# Patient Record
Sex: Female | Born: 1975 | Race: White | Hispanic: No | Marital: Married | Smoking: Never smoker
Health system: Southern US, Community
[De-identification: ages and names within clinical notes are randomized; demographics above are authoritative.]

## PROBLEM LIST (undated history)

## (undated) DIAGNOSIS — R42 Dizziness and giddiness: Secondary | ICD-10-CM

## (undated) HISTORY — PX: TUBAL LIGATION: SHX77

## (undated) HISTORY — PX: CHOLECYSTECTOMY: SHX55

---

## 2001-09-16 ENCOUNTER — Emergency Department (HOSPITAL_COMMUNITY): Admission: EM | Admit: 2001-09-16 | Discharge: 2001-09-16 | Payer: Self-pay | Admitting: Emergency Medicine

## 2004-02-26 ENCOUNTER — Emergency Department (HOSPITAL_COMMUNITY): Admission: EM | Admit: 2004-02-26 | Discharge: 2004-02-26 | Payer: Self-pay | Admitting: Emergency Medicine

## 2004-07-07 ENCOUNTER — Other Ambulatory Visit: Admission: RE | Admit: 2004-07-07 | Discharge: 2004-07-07 | Payer: Self-pay | Admitting: Family Medicine

## 2005-12-02 ENCOUNTER — Inpatient Hospital Stay (HOSPITAL_COMMUNITY): Admission: AD | Admit: 2005-12-02 | Discharge: 2005-12-02 | Payer: Self-pay | Admitting: Obstetrics

## 2006-01-21 ENCOUNTER — Inpatient Hospital Stay (HOSPITAL_COMMUNITY): Admission: AD | Admit: 2006-01-21 | Discharge: 2006-01-21 | Payer: Self-pay | Admitting: Obstetrics

## 2006-02-08 ENCOUNTER — Inpatient Hospital Stay (HOSPITAL_COMMUNITY): Admission: AD | Admit: 2006-02-08 | Discharge: 2006-02-09 | Payer: Self-pay | Admitting: Obstetrics

## 2006-02-13 ENCOUNTER — Inpatient Hospital Stay (HOSPITAL_COMMUNITY): Admission: AD | Admit: 2006-02-13 | Discharge: 2006-02-13 | Payer: Self-pay | Admitting: Obstetrics

## 2006-02-16 ENCOUNTER — Inpatient Hospital Stay (HOSPITAL_COMMUNITY): Admission: AD | Admit: 2006-02-16 | Discharge: 2006-02-18 | Payer: Self-pay | Admitting: Obstetrics

## 2006-02-16 ENCOUNTER — Encounter (INDEPENDENT_AMBULATORY_CARE_PROVIDER_SITE_OTHER): Payer: Self-pay | Admitting: Specialist

## 2007-06-22 ENCOUNTER — Inpatient Hospital Stay (HOSPITAL_COMMUNITY): Admission: AD | Admit: 2007-06-22 | Discharge: 2007-06-22 | Payer: Self-pay | Admitting: Obstetrics

## 2008-01-22 ENCOUNTER — Inpatient Hospital Stay (HOSPITAL_COMMUNITY): Admission: AD | Admit: 2008-01-22 | Discharge: 2008-01-24 | Payer: Self-pay | Admitting: Obstetrics

## 2009-12-04 ENCOUNTER — Ambulatory Visit (HOSPITAL_COMMUNITY): Admission: RE | Admit: 2009-12-04 | Discharge: 2009-12-04 | Payer: Self-pay | Admitting: Obstetrics

## 2010-01-01 ENCOUNTER — Inpatient Hospital Stay (HOSPITAL_COMMUNITY): Admission: AD | Admit: 2010-01-01 | Discharge: 2010-01-01 | Payer: Self-pay | Admitting: Obstetrics

## 2010-01-07 ENCOUNTER — Ambulatory Visit (HOSPITAL_COMMUNITY): Admission: RE | Admit: 2010-01-07 | Discharge: 2010-01-07 | Payer: Self-pay | Admitting: Obstetrics

## 2010-02-17 ENCOUNTER — Ambulatory Visit (HOSPITAL_COMMUNITY): Admission: RE | Admit: 2010-02-17 | Discharge: 2010-02-17 | Payer: Self-pay | Admitting: Obstetrics

## 2010-03-31 ENCOUNTER — Ambulatory Visit: Payer: Self-pay | Admitting: Nurse Practitioner

## 2010-03-31 ENCOUNTER — Inpatient Hospital Stay (HOSPITAL_COMMUNITY): Admission: AD | Admit: 2010-03-31 | Discharge: 2010-03-31 | Payer: Self-pay | Admitting: Obstetrics

## 2010-04-23 ENCOUNTER — Inpatient Hospital Stay (HOSPITAL_COMMUNITY): Admission: RE | Admit: 2010-04-23 | Discharge: 2010-04-25 | Payer: Self-pay | Admitting: Obstetrics

## 2010-04-24 ENCOUNTER — Encounter (INDEPENDENT_AMBULATORY_CARE_PROVIDER_SITE_OTHER): Payer: Self-pay | Admitting: Obstetrics

## 2011-01-03 LAB — CBC
HCT: 30.9 % — ABNORMAL LOW (ref 36.0–46.0)
HCT: 34.9 % — ABNORMAL LOW (ref 36.0–46.0)
Hemoglobin: 10.7 g/dL — ABNORMAL LOW (ref 12.0–15.0)
Hemoglobin: 12.1 g/dL (ref 12.0–15.0)
MCH: 29.6 pg (ref 26.0–34.0)
MCH: 29.8 pg (ref 26.0–34.0)
MCHC: 34.5 g/dL (ref 30.0–36.0)
MCHC: 34.6 g/dL (ref 30.0–36.0)
MCV: 85.5 fL (ref 78.0–100.0)
MCV: 86.3 fL (ref 78.0–100.0)
Platelets: 146 10*3/uL — ABNORMAL LOW (ref 150–400)
Platelets: 166 10*3/uL (ref 150–400)
RBC: 3.58 MIL/uL — ABNORMAL LOW (ref 3.87–5.11)
RBC: 4.08 MIL/uL (ref 3.87–5.11)
RDW: 14.7 % (ref 11.5–15.5)
RDW: 14.8 % (ref 11.5–15.5)
WBC: 14 10*3/uL — ABNORMAL HIGH (ref 4.0–10.5)
WBC: 9.6 10*3/uL (ref 4.0–10.5)

## 2011-01-03 LAB — RPR: RPR Ser Ql: NONREACTIVE

## 2011-01-04 LAB — URINALYSIS, ROUTINE W REFLEX MICROSCOPIC
Bilirubin Urine: NEGATIVE
Glucose, UA: NEGATIVE mg/dL
Ketones, ur: NEGATIVE mg/dL
Leukocytes, UA: NEGATIVE
Nitrite: NEGATIVE
Protein, ur: NEGATIVE mg/dL
Specific Gravity, Urine: 1.01 (ref 1.005–1.030)
Urobilinogen, UA: 0.2 mg/dL (ref 0.0–1.0)
pH: 7 (ref 5.0–8.0)

## 2011-01-04 LAB — URINE MICROSCOPIC-ADD ON

## 2011-01-10 LAB — CBC
HCT: 33.1 % — ABNORMAL LOW (ref 36.0–46.0)
Hemoglobin: 11.2 g/dL — ABNORMAL LOW (ref 12.0–15.0)
MCHC: 33.9 g/dL (ref 30.0–36.0)
MCV: 86.5 fL (ref 78.0–100.0)
Platelets: 171 10*3/uL (ref 150–400)
RBC: 3.83 MIL/uL — ABNORMAL LOW (ref 3.87–5.11)
RDW: 13.4 % (ref 11.5–15.5)
WBC: 11.4 10*3/uL — ABNORMAL HIGH (ref 4.0–10.5)

## 2011-01-10 LAB — URINALYSIS, ROUTINE W REFLEX MICROSCOPIC
Bilirubin Urine: NEGATIVE
Glucose, UA: NEGATIVE mg/dL
Ketones, ur: NEGATIVE mg/dL
Leukocytes, UA: NEGATIVE
Nitrite: NEGATIVE
Protein, ur: NEGATIVE mg/dL
Specific Gravity, Urine: <1.005 — ABNORMAL LOW (ref 1.005–1.030)
Urobilinogen, UA: 0.2 mg/dL (ref 0.0–1.0)
pH: 7.5 (ref 5.0–8.0)

## 2011-01-10 LAB — COMPREHENSIVE METABOLIC PANEL
ALT: 12 U/L (ref 0–35)
AST: 14 U/L (ref 0–37)
Albumin: 3.2 g/dL — ABNORMAL LOW (ref 3.5–5.2)
Alkaline Phosphatase: 62 U/L (ref 39–117)
BUN: 6 mg/dL (ref 6–23)
CO2: 24 mEq/L (ref 19–32)
Calcium: 8.9 mg/dL (ref 8.4–10.5)
Chloride: 104 mEq/L (ref 96–112)
Creatinine, Ser: 0.46 mg/dL (ref 0.4–1.2)
GFR calc Af Amer: >60 mL/min (ref >60)
GFR calc non Af Amer: >60 mL/min (ref >60)
Glucose, Bld: 83 mg/dL (ref 70–99)
Potassium: 3.5 mEq/L (ref 3.5–5.1)
Sodium: 136 mEq/L (ref 135–145)
Total Bilirubin: 0.1 mg/dL — ABNORMAL LOW (ref 0.3–1.2)
Total Protein: 6.4 g/dL (ref 6.0–8.3)

## 2011-01-10 LAB — GLUCOSE, CAPILLARY: Glucose-Capillary: 85 mg/dL (ref 70–99)

## 2011-01-10 LAB — URINE MICROSCOPIC-ADD ON

## 2011-07-13 LAB — CBC
HCT: 37
Hemoglobin: 12.9
MCHC: 34.8
MCV: 86
Platelets: 148 — ABNORMAL LOW
RBC: 4.3
RDW: 14.6
WBC: 8.1

## 2011-07-13 LAB — RPR: RPR Ser Ql: NONREACTIVE

## 2011-07-30 LAB — URINE MICROSCOPIC-ADD ON

## 2011-07-30 LAB — URINALYSIS, ROUTINE W REFLEX MICROSCOPIC
Bilirubin Urine: NEGATIVE
Glucose, UA: NEGATIVE
Ketones, ur: NEGATIVE
Leukocytes, UA: NEGATIVE
Nitrite: NEGATIVE
Protein, ur: NEGATIVE
Specific Gravity, Urine: 1.025
Urobilinogen, UA: 1
pH: 6

## 2011-07-30 LAB — WET PREP, GENITAL
Clue Cells Wet Prep HPF POC: NONE SEEN
Trich, Wet Prep: NONE SEEN
Yeast Wet Prep HPF POC: NONE SEEN

## 2011-07-30 LAB — GC/CHLAMYDIA PROBE AMP, GENITAL
Chlamydia, DNA Probe: NEGATIVE
GC Probe Amp, Genital: NEGATIVE

## 2011-12-22 ENCOUNTER — Emergency Department (INDEPENDENT_AMBULATORY_CARE_PROVIDER_SITE_OTHER): Payer: 59

## 2011-12-22 ENCOUNTER — Emergency Department (HOSPITAL_BASED_OUTPATIENT_CLINIC_OR_DEPARTMENT_OTHER)
Admission: EM | Admit: 2011-12-22 | Discharge: 2011-12-23 | Disposition: A | Discharge status: Home / Self Care | Payer: 59 | Attending: Emergency Medicine | Admitting: Emergency Medicine

## 2011-12-22 ENCOUNTER — Other Ambulatory Visit: Payer: Self-pay

## 2011-12-22 ENCOUNTER — Encounter (HOSPITAL_BASED_OUTPATIENT_CLINIC_OR_DEPARTMENT_OTHER): Payer: Self-pay | Admitting: *Deleted

## 2011-12-22 DIAGNOSIS — R112 Nausea with vomiting, unspecified: Secondary | ICD-10-CM | POA: Insufficient documentation

## 2011-12-22 DIAGNOSIS — H8110 Benign paroxysmal vertigo, unspecified ear: Secondary | ICD-10-CM | POA: Insufficient documentation

## 2011-12-22 DIAGNOSIS — J40 Bronchitis, not specified as acute or chronic: Secondary | ICD-10-CM

## 2011-12-22 DIAGNOSIS — E860 Dehydration: Secondary | ICD-10-CM

## 2011-12-22 DIAGNOSIS — R55 Syncope and collapse: Secondary | ICD-10-CM | POA: Insufficient documentation

## 2011-12-22 HISTORY — DX: Dizziness and giddiness: R42

## 2011-12-22 LAB — CBC
HCT: 36.6 % (ref 36.0–46.0)
Hemoglobin: 12.9 g/dL (ref 12.0–15.0)
MCH: 28.5 pg (ref 26.0–34.0)
MCHC: 35.2 g/dL (ref 30.0–36.0)
MCV: 80.8 fL (ref 78.0–100.0)
Platelets: 232 10*3/uL (ref 150–400)
RBC: 4.53 MIL/uL (ref 3.87–5.11)
RDW: 13 % (ref 11.5–15.5)
WBC: 17.2 10*3/uL — ABNORMAL HIGH (ref 4.0–10.5)

## 2011-12-22 LAB — BASIC METABOLIC PANEL
BUN: 12 mg/dL (ref 6–23)
CO2: 20 mEq/L (ref 19–32)
Calcium: 9.5 mg/dL (ref 8.4–10.5)
Chloride: 104 mEq/L (ref 96–112)
Creatinine, Ser: 0.6 mg/dL (ref 0.50–1.10)
GFR calc Af Amer: >90 mL/min (ref >90)
GFR calc non Af Amer: >90 mL/min (ref >90)
Glucose, Bld: 150 mg/dL — ABNORMAL HIGH (ref 70–99)
Potassium: 3.6 mEq/L (ref 3.5–5.1)
Sodium: 138 mEq/L (ref 135–145)

## 2011-12-22 MED ORDER — ONDANSETRON HCL 4 MG/2ML IJ SOLN
INTRAMUSCULAR | Status: AC
  Administered 2011-12-23: 4 mg
  Filled 2011-12-22: qty 2

## 2011-12-22 MED ORDER — ONDANSETRON HCL 4 MG/2ML IJ SOLN
4.0000 mg | Freq: Once | INTRAMUSCULAR | Status: AC
  Administered 2011-12-22: 4 mg via INTRAVENOUS
  Filled 2011-12-22: qty 2

## 2011-12-22 MED ORDER — MECLIZINE HCL 25 MG PO TABS
25.0000 mg | ORAL_TABLET | Freq: Once | ORAL | Status: AC
  Administered 2011-12-22: 25 mg via ORAL
  Filled 2011-12-22: qty 1

## 2011-12-22 MED ORDER — SODIUM CHLORIDE 0.9 % IV BOLUS (SEPSIS)
1000.0000 mL | Freq: Once | INTRAVENOUS | Status: AC
  Administered 2011-12-22: 1000 mL via INTRAVENOUS

## 2011-12-22 NOTE — ED Notes (Signed)
DR Palumbo at bedside. 

## 2011-12-22 NOTE — ED Notes (Signed)
Pt brought in by EMS , syncopal episode at home, HX vertigo

## 2011-12-22 NOTE — ED Notes (Signed)
Pt reports sensation of vertigo while at home this evening. Pt took a meclizine PTA, but states it did not help. Pt c/o dizziness and nausea. Pt received zofran 4mg  en route by EMS. Pt no longer vomiting, but states she is very dizzy. Lights dimmed for comfort.

## 2011-12-23 DIAGNOSIS — R404 Transient alteration of awareness: Secondary | ICD-10-CM

## 2011-12-23 LAB — URINALYSIS, ROUTINE W REFLEX MICROSCOPIC
Bilirubin Urine: NEGATIVE
Glucose, UA: NEGATIVE mg/dL
Hgb urine dipstick: NEGATIVE
Ketones, ur: >80 mg/dL — AB
Leukocytes, UA: NEGATIVE
Nitrite: NEGATIVE
Protein, ur: NEGATIVE mg/dL
Specific Gravity, Urine: 1.014 (ref 1.005–1.030)
Urobilinogen, UA: 1 mg/dL (ref 0.0–1.0)
pH: 7 (ref 5.0–8.0)

## 2011-12-23 LAB — PREGNANCY, URINE: Preg Test, Ur: NEGATIVE

## 2011-12-23 MED ORDER — AZITHROMYCIN 250 MG PO TABS: 250.0000 mg | ORAL_TABLET | Freq: Every day | ORAL | Status: AC

## 2011-12-23 MED ORDER — LORAZEPAM 2 MG/ML IJ SOLN
INTRAMUSCULAR | Status: AC
  Administered 2011-12-23: 1 mg
  Filled 2011-12-23: qty 1

## 2011-12-23 MED ORDER — MECLIZINE HCL 25 MG PO TABS
25.0000 mg | ORAL_TABLET | Freq: Once | ORAL | Status: AC
  Administered 2011-12-23: 25 mg via ORAL
  Filled 2011-12-23: qty 1

## 2011-12-23 MED ORDER — ONDANSETRON HCL 4 MG/2ML IJ SOLN
4.0000 mg | Freq: Once | INTRAMUSCULAR | Status: AC
  Administered 2011-12-23: 4 mg via INTRAVENOUS
  Filled 2011-12-23: qty 2

## 2011-12-23 MED ORDER — MECLIZINE HCL 12.5 MG PO TABS: 12.5000 mg | ORAL_TABLET | Freq: Three times a day (TID) | ORAL | Status: AC | PRN

## 2011-12-23 NOTE — ED Notes (Signed)
Pt reports she still feels dizzy. nasuea mildly relieved. Pt resting with eyes closed.

## 2011-12-23 NOTE — ED Notes (Signed)
Dr Nicanor Alcon at bedside speaking with patient/husband.

## 2011-12-23 NOTE — ED Notes (Signed)
Pt was able to sit up, stand up, and ambulate without dizziness occuring. Pt states she feels ready for discharge. Husband at bedside. EDP made aware.

## 2011-12-23 NOTE — ED Notes (Signed)
Pt vomited large amt of emesis following medication ingestion. Pt reports severe nausea. Will notify EDP

## 2011-12-23 NOTE — ED Provider Notes (Signed)
History     CSN: [id number]  Arrival date & time 12/22/11  2216   First MD Initiated Contact with Patient 12/22/11 2334      (Consider location/radiation/quality/duration/timing/severity/associated sxs/prior treatment) The history is provided by the patient and a relative. No language interpreter was used.  Patient presents with sudden onset intense vertigo.  Positional.  Worse with head movement to either side.  No tinnitus.  No hearing loss.  No weakness or numbness.  No visual changes no speech difficulty.  No focal neuro deficits.  There is associated nausea and vomiting.  No OCP.  No chest pain no shortness of breath.  No DOE.  No leg swelling no long car trips or plane trips.  PERC negative Patient doe not recall passing out but husband who is not present reportedly witness LOC for a sec.  No seizure like activity Past Medical History  Diagnosis Date  . Vertigo     Past Surgical History  Procedure Date  . Tubal ligation   . Cholecystectomy     History reviewed. No pertinent family history.  History  Substance Use Topics  . Smoking status: Never Smoker   . Smokeless tobacco: Not on file  . Alcohol Use: No    OB History    Grav Para Term Preterm Abortions TAB SAB Ect Mult Living                  Review of Systems  Constitutional: Negative.  Negative for fever.  HENT: Negative.  Negative for hearing loss, ear pain, neck pain and neck stiffness.   Eyes: Negative for photophobia and pain.  Respiratory: Negative.  Negative for shortness of breath.   Cardiovascular: Positive for syncope. Negative for chest pain and leg swelling.  Gastrointestinal: Positive for nausea and vomiting. Negative for abdominal distention.  Genitourinary: Negative for difficulty urinating.  Musculoskeletal: Negative.   Skin: Negative.   Neurological: Negative for seizures, weakness and numbness.  Hematological: Negative.   Psychiatric/Behavioral: Negative.     Allergies  Review of  patient's allergies indicates no known allergies.  Home Medications   Current Outpatient Rx  Name Route Sig Dispense Refill  . VITAMIN B COMPLEX PO Oral Take 1 tablet by mouth daily.      BP 113/65  Pulse 94  Temp(Src) 97.6 F (36.4 C) (Oral)  Resp 18  Ht 4' 11.5" (1.511 m)  Wt 180 lb (81.647 kg)  BMI 35.75 kg/m2  SpO2 98%  LMP 12/02/2011  Physical Exam  Constitutional: She is oriented to person, place, and time. She appears well-developed and well-nourished. No distress.  HENT:  Head: Normocephalic and atraumatic.  Right Ear: No mastoid tenderness. Tympanic membrane is not injected. No hemotympanum.  Left Ear: No mastoid tenderness. Tympanic membrane is not injected. No hemotympanum.  Mouth/Throat: Oropharynx is clear and moist. No oropharyngeal exudate.  Eyes: Conjunctivae and EOM are normal. Pupils are equal, round, and reactive to light.  Neck: Normal range of motion. Neck supple.  Cardiovascular: Normal rate and regular rhythm.   Pulmonary/Chest: Effort normal and breath sounds normal.  Abdominal: Soft. Bowel sounds are normal. There is no tenderness. There is no rebound and no guarding.  Musculoskeletal: Normal range of motion. She exhibits no edema.  Lymphadenopathy:    She has no cervical adenopathy.  Neurological: She is alert and oriented to person, place, and time. She has normal reflexes. No cranial nerve deficit.  Skin: Skin is warm and dry.  Psychiatric: She has a normal mood and  affect.    ED Course  Procedures (including critical care time)  Labs Reviewed  CBC - Abnormal; Notable for the following:    WBC 17.2 (*)    All other components within normal limits  BASIC METABOLIC PANEL - Abnormal; Notable for the following:    Glucose, Bld 150 (*)    All other components within normal limits  URINALYSIS, ROUTINE W REFLEX MICROSCOPIC - Abnormal; Notable for the following:    Ketones, ur >80 (*)    All other components within normal limits  PREGNANCY,  URINE   Dg Chest 2 View  12/23/2011  *RADIOLOGY REPORT*  Clinical Data: Loss of consciousness  CHEST - 2 VIEW  Comparison: None  Findings: Upper-normal size of cardiac silhouette. Mediastinal contours and pulmonary vascularity normal. Peribronchial thickening. No pulmonary infiltrate, pleural effusion, or pneumothorax. Bones unremarkable.  IMPRESSION: Minimal bronchitic changes.  Original Report Authenticated By: Lollie Marrow, M.D.     No diagnosis found.    MDM   Date: 12/23/2011  Rate: 90  Rhythm: normal sinus rhythm  QRS Axis: normal  Intervals: normal  ST/T Wave abnormalities: normal  Conduction Disutrbances:none  Narrative Interpretation:   Old EKG Reviewed: none available    Return for worsening symptoms or weakness numbness, chest pain shortness of breath or any concerns.  Follow up with your doctor for recheck in 48 hours      Michaelyn Wall K Maks Cavallero-Rasch, MD 12/23/11 [phone]

## 2011-12-23 NOTE — Discharge Instructions (Signed)
Bronchitis Bronchitis is the body's way of reacting to injury and/or infection (inflammation) of the bronchi. Bronchi are the air tubes that extend from the windpipe into the lungs. If the inflammation becomes severe, it may cause shortness of breath. CAUSES  Inflammation may be caused by:  A virus.   Germs (bacteria).   Dust.   Allergens.   Pollutants and many other irritants.  The cells lining the bronchial tree are covered with tiny hairs (cilia). These constantly beat upward, away from the lungs, toward the mouth. This keeps the lungs free of pollutants. When these cells become too irritated and are unable to do their job, mucus begins to develop. This causes the characteristic cough of bronchitis. The cough clears the lungs when the cilia are unable to do their job. Without either of these protective mechanisms, the mucus would settle in the lungs. Then you would develop pneumonia. Smoking is a common cause of bronchitis and can contribute to pneumonia. Stopping this habit is the single most important thing you can do to help yourself. TREATMENT   Your caregiver may prescribe an antibiotic if the cough is caused by bacteria. Also, medicines that open up your airways make it easier to breathe. Your caregiver may also recommend or prescribe an expectorant. It will loosen the mucus to be coughed up. Only take over-the-counter or prescription medicines for pain, discomfort, or fever as directed by your caregiver.   Removing whatever causes the problem (smoking, for example) is critical to preventing the problem from getting worse.   Cough suppressants may be prescribed for relief of cough symptoms.   Inhaled medicines may be prescribed to help with symptoms now and to help prevent problems from returning.   For those with recurrent (chronic) bronchitis, there may be a need for steroid medicines.  SEEK IMMEDIATE MEDICAL CARE IF:   During treatment, you develop more pus-like mucus  (purulent sputum).   You have a fever.   Your baby is older than 3 months with a rectal temperature of 102 F (38.9 C) or higher.   Your baby is 64 months old or younger with a rectal temperature of 100.4 F (38 C) or higher.   You become progressively more ill.   You have increased difficulty breathing, wheezing, or shortness of breath.  It is necessary to seek immediate medical care if you are elderly or sick from any other disease. MAKE SURE YOU:   Understand these instructions.   Will watch your condition.   Will get help right away if you are not doing well or get worse.  Document Released: 10/04/2005 Document Revised: 09/23/2011 Document Reviewed: 08/13/2008 [facility] Patient Information 2012 [place], [place].Benign Positional Vertigo Vertigo means you feel like you or your surroundings are moving when they are not. Benign positional vertigo is the most common form of vertigo. Benign means that the cause of your condition is not serious. Benign positional vertigo is more common in older adults. CAUSES  Benign positional vertigo is the result of an upset in the labyrinth system. This is an area in the middle ear that helps control your balance. This may be caused by a viral infection, head injury, or repetitive motion. However, often no specific cause is found. SYMPTOMS  Symptoms of benign positional vertigo occur when you move your head or eyes in different directions. Some of the symptoms may include:  Loss of balance and falls.   Vomiting.   Blurred vision.   Dizziness.   Nausea.   Involuntary eye  movements (nystagmus).  DIAGNOSIS  Benign positional vertigo is usually diagnosed by physical exam. If the specific cause of your benign positional vertigo is unknown, your caregiver may perform imaging tests, such as magnetic resonance imaging (MRI) or computed tomography (CT). TREATMENT  Your caregiver may recommend movements or procedures to correct the benign positional  vertigo. Medicines such as meclizine, benzodiazepines, and medicines for nausea may be used to treat your symptoms. In rare cases, if your symptoms are caused by certain conditions that affect the inner ear, you may need surgery. HOME CARE INSTRUCTIONS   Follow your caregiver's instructions.   Move slowly. Do not make sudden body or head movements.   Avoid driving.   Avoid operating heavy machinery.   Avoid performing any tasks that would be dangerous to you or others during a vertigo episode.   Drink enough fluids to keep your urine clear or pale yellow.  SEEK IMMEDIATE MEDICAL CARE IF:   You develop problems with walking, weakness, numbness, or using your arms, hands, or legs.   You have difficulty speaking.   You develop severe headaches.   Your nausea or vomiting continues or gets worse.   You develop visual changes.   Your family or friends notice any behavioral changes.   Your condition gets worse.   You have a fever.   You develop a stiff neck or sensitivity to light.  MAKE SURE YOU:   Understand these instructions.   Will watch your condition.   Will get help right away if you are not doing well or get worse.  Document Released: 07/12/2006 Document Revised: 09/23/2011 Document Reviewed: 06/24/2011 [facility] Patient Information 2012 [place], [place].

## 2011-12-23 NOTE — ED Notes (Signed)
Pt in XR at this time

## 2013-06-29 ENCOUNTER — Ambulatory Visit: Payer: 59

## 2015-02-14 ENCOUNTER — Emergency Department (HOSPITAL_COMMUNITY): Payer: Managed Care, Other (non HMO)

## 2015-02-14 ENCOUNTER — Emergency Department (HOSPITAL_COMMUNITY)
Admission: EM | Admit: 2015-02-14 | Discharge: 2015-02-14 | Disposition: A | Discharge status: Home / Self Care | Payer: Managed Care, Other (non HMO) | Attending: Emergency Medicine | Admitting: Emergency Medicine

## 2015-02-14 ENCOUNTER — Encounter (HOSPITAL_COMMUNITY): Payer: Self-pay | Admitting: Emergency Medicine

## 2015-02-14 DIAGNOSIS — Z79899 Other long term (current) drug therapy: Secondary | ICD-10-CM | POA: Insufficient documentation

## 2015-02-14 DIAGNOSIS — R519 Headache, unspecified: Secondary | ICD-10-CM

## 2015-02-14 DIAGNOSIS — R51 Headache: Secondary | ICD-10-CM | POA: Insufficient documentation

## 2015-02-14 DIAGNOSIS — R079 Chest pain, unspecified: Secondary | ICD-10-CM | POA: Diagnosis not present

## 2015-02-14 LAB — CBC
HCT: 41.3 % (ref 36.0–46.0)
Hemoglobin: 13.5 g/dL (ref 12.0–15.0)
MCH: 28.1 pg (ref 26.0–34.0)
MCHC: 32.7 g/dL (ref 30.0–36.0)
MCV: 85.9 fL (ref 78.0–100.0)
Platelets: 212 10*3/uL (ref 150–400)
RBC: 4.81 MIL/uL (ref 3.87–5.11)
RDW: 12.8 % (ref 11.5–15.5)
WBC: 8.8 10*3/uL (ref 4.0–10.5)

## 2015-02-14 LAB — BASIC METABOLIC PANEL
Anion gap: 9 (ref 5–15)
BUN: 14 mg/dL (ref 6–23)
CO2: 24 mmol/L (ref 19–32)
Calcium: 9.1 mg/dL (ref 8.4–10.5)
Chloride: 107 mmol/L (ref 96–112)
Creatinine, Ser: 0.75 mg/dL (ref 0.50–1.10)
GFR calc Af Amer: >90 mL/min (ref >90)
GFR calc non Af Amer: >90 mL/min (ref >90)
Glucose, Bld: 122 mg/dL — ABNORMAL HIGH (ref 70–99)
Potassium: 3.6 mmol/L (ref 3.5–5.1)
Sodium: 140 mmol/L (ref 135–145)

## 2015-02-14 LAB — I-STAT TROPONIN, ED: Troponin i, poc: 0 ng/mL (ref 0.00–0.08)

## 2015-02-14 MED ORDER — METOCLOPRAMIDE HCL 5 MG/ML IJ SOLN
10.0000 mg | Freq: Once | INTRAMUSCULAR | Status: AC
  Administered 2015-02-14: 10 mg via INTRAVENOUS
  Filled 2015-02-14: qty 2

## 2015-02-14 MED ORDER — DIPHENHYDRAMINE HCL 50 MG/ML IJ SOLN
25.0000 mg | Freq: Once | INTRAMUSCULAR | Status: AC
  Administered 2015-02-14: 25 mg via INTRAVENOUS
  Filled 2015-02-14: qty 1

## 2015-02-14 MED ORDER — SODIUM CHLORIDE 0.9 % IV BOLUS (SEPSIS)
1000.0000 mL | Freq: Once | INTRAVENOUS | Status: AC
  Administered 2015-02-14: 1000 mL via INTRAVENOUS

## 2015-02-14 NOTE — Discharge Instructions (Signed)

## 2015-02-14 NOTE — ED Provider Notes (Signed)
CSN: [id number]     Arrival date & time 02/14/15  1191 History   First MD Initiated Contact with Patient 02/14/15 [phone]     Chief Complaint  Patient presents with  . Chest Pain  . Headache     The history is provided by the patient. No language interpreter was used.   Ms. Beem presents today for evaluation of headache. She woke around 5 AM with diffuse frontal headache. The pain is described as throbbing and aching in nature. The pain is better when she applies pressure to her forehead. There is no change in her headache with movement, lights, noise. She does have associated nausea, vomiting, diaphoresis. She has an intermittent fluttering sensation in her chest. She denies any chest pain, shortness of breath, fevers, numbness, weakness. She has a history of vertigo but no other medical problems. She takes no medications. She has history of tubal ligation. She has a family hx/o aneurysm in her grandmother.  She does not get regular headaches. Symptoms are moderate, constant, worsening.  Past Medical History  Diagnosis Date  . Vertigo    Past Surgical History  Procedure Laterality Date  . Tubal ligation    . Cholecystectomy     History reviewed. No pertinent family history. History  Substance Use Topics  . Smoking status: Never Smoker   . Smokeless tobacco: Not on file  . Alcohol Use: No   OB History    No data available     Review of Systems  All other systems reviewed and are negative.     Allergies  Review of patient's allergies indicates no known allergies.  Home Medications   Prior to Admission medications   Medication Sig Start Date End Date Taking? Authorizing Provider  B Complex Vitamins (VITAMIN B COMPLEX PO) Take 1 tablet by mouth daily.    Historical Provider, MD   BP 144/77 mmHg  Pulse 88  Temp(Src) 98.5 F (36.9 C) (Oral)  Resp 19  SpO2 98%  LMP 02/11/2015 Physical Exam  Constitutional: She is oriented to person, place, and time. She appears  well-developed and well-nourished.  HENT:  Head: Normocephalic and atraumatic.  Right Ear: External ear normal.  Left Ear: External ear normal.  Eyes: EOM are normal. Pupils are equal, round, and reactive to light.  Neck: Normal range of motion. Neck supple.  Cardiovascular: Normal rate and regular rhythm.   No murmur heard. Pulmonary/Chest: Effort normal and breath sounds normal. No respiratory distress.  Abdominal: Soft. There is no tenderness. There is no rebound and no guarding.  Musculoskeletal: She exhibits no edema or tenderness.  Neurological: She is alert and oriented to person, place, and time. No cranial nerve deficit. Coordination normal.  Skin: Skin is warm and dry.  Psychiatric: She has a normal mood and affect. Her behavior is normal.  Nursing note and vitals reviewed.   ED Course  Procedures (including critical care time) Labs Review Labs Reviewed  BASIC METABOLIC PANEL - Abnormal; Notable for the following:    Glucose, Bld 122 (*)    All other components within normal limits  CBC  I-STAT TROPOININ, ED    Imaging Review Ct Head Wo Contrast  02/14/2015   CLINICAL DATA:  39 year old female with headache on waking today. Diaphoresis and chest pain. Initial encounter.  EXAM: CT HEAD WITHOUT CONTRAST  TECHNIQUE: Contiguous axial images were obtained from the base of the skull through the vertex without intravenous contrast.  COMPARISON:  None.  FINDINGS: Visualized paranasal sinuses and mastoids  are clear. No acute osseous abnormality identified. Visualized orbits and scalp soft tissues are within normal limits.  Cerebral volume is normal. No midline shift, ventriculomegaly, mass effect, evidence of mass lesion, intracranial hemorrhage or evidence of cortically based acute infarction. Gray-white matter differentiation is within normal limits throughout the brain. No suspicious intracranial vascular hyperdensity.  IMPRESSION: Normal noncontrast CT appearance of the brain.    Electronically Signed   By: Odessa FlemingH  Hall M.D.   On: 02/14/2015 10:14   Dg Chest Port 1 View  02/14/2015   CLINICAL DATA:  39 year old female with shortness of breath and mid chest discomfort. Fever since last night. Headache. Initial encounter.  EXAM: PORTABLE CHEST - 1 VIEW  COMPARISON:  12/23/2011.  FINDINGS: Portable AP semi upright view at 0924 hours. Lung volumes are stable and within normal limits. Normal cardiac size and mediastinal contours. Visualized tracheal air column is within normal limits. Mildly increased interstitial markings in both lungs are chronic. No superimposed pneumothorax, pulmonary edema, pleural effusion or confluent pulmonary opacity. No acute osseous abnormality identified.  IMPRESSION: No acute cardiopulmonary abnormality.   Electronically Signed   By: Odessa FlemingH  Hall M.D.   On: 02/14/2015 09:35     EKG Interpretation   Date/Time:  Friday February 14 2015 09:04:51 EDT Ventricular Rate:  81 PR Interval:  176 QRS Duration: 90 QT Interval:  380 QTC Calculation: 441 R Axis:   65 Text Interpretation:  Sinus rhythm Ventricular premature complex Baseline  wander in lead(s) V4 V5 V6 artifact present Confirmed by Lincoln Brighamees, Liz [phone]([id number])  on 02/14/2015 9:07:46 AM      MDM   Final diagnoses:  Acute nonintractable headache, unspecified headache type    Patient here for evaluation of headache.  Clinical picture is not c/w SAH or meningitis.  Patient feels improved on recheck with HA resolved.  Discussed PCP follow up as well as return precautions.      Tilden FossaElizabeth Alycea Segoviano, MD 02/14/15 [phone]

## 2015-02-14 NOTE — ED Notes (Signed)
Pt c/o 9/10 headache onset this morning when she awoke, along with "fluttering feeling" in her chest, diaphoresis.

## 2016-05-04 DIAGNOSIS — Z131 Encounter for screening for diabetes mellitus: Secondary | ICD-10-CM | POA: Diagnosis not present

## 2016-05-04 DIAGNOSIS — Z1329 Encounter for screening for other suspected endocrine disorder: Secondary | ICD-10-CM | POA: Diagnosis not present

## 2016-05-04 DIAGNOSIS — Z1151 Encounter for screening for human papillomavirus (HPV): Secondary | ICD-10-CM | POA: Diagnosis not present

## 2016-05-04 DIAGNOSIS — R1032 Left lower quadrant pain: Secondary | ICD-10-CM | POA: Diagnosis not present

## 2016-05-04 DIAGNOSIS — Z13220 Encounter for screening for lipoid disorders: Secondary | ICD-10-CM | POA: Diagnosis not present

## 2016-05-04 DIAGNOSIS — Z130 Encounter for screening for diseases of the blood and blood-forming organs and certain disorders involving the immune mechanism: Secondary | ICD-10-CM | POA: Diagnosis not present

## 2016-05-04 DIAGNOSIS — Z0000 Encounter for general adult medical examination without abnormal findings: Secondary | ICD-10-CM | POA: Diagnosis not present

## 2016-05-04 DIAGNOSIS — Z6833 Body mass index (BMI) 33.0-33.9, adult: Secondary | ICD-10-CM | POA: Diagnosis not present

## 2016-05-04 DIAGNOSIS — Z01419 Encounter for gynecological examination (general) (routine) without abnormal findings: Secondary | ICD-10-CM | POA: Diagnosis not present

## 2016-05-04 DIAGNOSIS — Z1231 Encounter for screening mammogram for malignant neoplasm of breast: Secondary | ICD-10-CM | POA: Diagnosis not present

## 2018-03-07 DIAGNOSIS — Z1231 Encounter for screening mammogram for malignant neoplasm of breast: Secondary | ICD-10-CM | POA: Diagnosis not present

## 2018-03-07 DIAGNOSIS — R61 Generalized hyperhidrosis: Secondary | ICD-10-CM | POA: Diagnosis not present

## 2018-03-07 DIAGNOSIS — E559 Vitamin D deficiency, unspecified: Secondary | ICD-10-CM | POA: Diagnosis not present

## 2018-03-07 DIAGNOSIS — Z6834 Body mass index (BMI) 34.0-34.9, adult: Secondary | ICD-10-CM | POA: Diagnosis not present

## 2018-03-07 DIAGNOSIS — Z1151 Encounter for screening for human papillomavirus (HPV): Secondary | ICD-10-CM | POA: Diagnosis not present

## 2018-03-07 DIAGNOSIS — Z131 Encounter for screening for diabetes mellitus: Secondary | ICD-10-CM | POA: Diagnosis not present

## 2018-03-07 DIAGNOSIS — Z01419 Encounter for gynecological examination (general) (routine) without abnormal findings: Secondary | ICD-10-CM | POA: Diagnosis not present

## 2018-04-21 ENCOUNTER — Other Ambulatory Visit: Payer: Self-pay

## 2018-04-21 ENCOUNTER — Encounter (HOSPITAL_COMMUNITY): Payer: Self-pay | Admitting: Emergency Medicine

## 2018-04-21 ENCOUNTER — Emergency Department (HOSPITAL_COMMUNITY)
Admission: EM | Admit: 2018-04-21 | Discharge: 2018-04-21 | Disposition: A | Discharge status: Home / Self Care | Payer: BLUE CROSS/BLUE SHIELD | Attending: Emergency Medicine | Admitting: Emergency Medicine

## 2018-04-21 ENCOUNTER — Emergency Department (HOSPITAL_COMMUNITY): Payer: BLUE CROSS/BLUE SHIELD

## 2018-04-21 DIAGNOSIS — M25562 Pain in left knee: Secondary | ICD-10-CM | POA: Diagnosis not present

## 2018-04-21 MED ORDER — ACETAMINOPHEN 500 MG PO TABS
1000.0000 mg | ORAL_TABLET | Freq: Once | ORAL | Status: AC
  Administered 2018-04-21: 1000 mg via ORAL
  Filled 2018-04-21: qty 2

## 2018-04-21 MED ORDER — HYDROCODONE-ACETAMINOPHEN 5-325 MG PO TABS: 1.0000 | ORAL_TABLET | Freq: Four times a day (QID) | ORAL | 0 refills | Status: AC | PRN

## 2018-04-21 NOTE — ED Triage Notes (Signed)
Pt complaint of left knee pain onset Monday; denies injury.

## 2018-04-21 NOTE — ED Notes (Signed)
Discharge instructions reviewed with patient. Patient verbalizes understanding. VSS.   

## 2018-04-21 NOTE — Discharge Instructions (Addendum)
You have been seen today for knee pain. There were no acute abnormalities on the x-rays, including no sign of fracture or dislocation, however, there could be injuries to the soft tissues, such as the ligaments or tendons that are not seen on xrays. There could also be what are called occult fractures that are small fractures not seen on xray. Pain: Take 600 mg of ibuprofen every 6 hours or 440 mg (over the counter dose) to 500 mg (prescription dose) of naproxen every 12 hours for the next 3 days. After this time, these medications may be used as needed for pain. Take these medications with food to avoid upset stomach. Choose only one of these medications, do not take them together.  Tylenol: Should you continue to have additional pain while taking the ibuprofen or naproxen, you may add in tylenol as needed. Your daily total maximum amount of tylenol from all sources should be limited to 4000mg /day for persons without liver problems, or 2000mg /day for those with liver problems. Vicodin: May take Vicodin as needed for severe pain.  Do not drive or perform other dangerous activities while taking the Vicodin.  Please note that each pill of Vicodin contains 325 mg of Tylenol and the above dosage limits apply. Ice: May apply ice to the area over the next 24 hours for 15 minutes at a time to reduce swelling. Elevation: Keep the extremity elevated as often as possible to reduce pain and inflammation. Support: Wear the knee brace for support and comfort. Wear this until pain resolves. You will be weight-bearing as tolerated, which means you can slowly start to put weight on the extremity and increase amount and frequency as pain allows. Follow up: If symptoms are improving, you may follow up with your primary care provider for any continued management. If symptoms are not starting to improve within a week, you should follow up with the orthopedic specialist within two weeks. Return: Return to the ED for numbness,  weakness, increasing pain, overall worsening symptoms, loss of function, or if symptoms are not improving, you have tried to follow up with the orthopedic specialist, and have been unable to do so.

## 2018-04-21 NOTE — ED Provider Notes (Signed)
[facility]-EMERGENCY DEPT Provider Note   CSN: [id number] Arrival date & time: 04/21/18  1357     History   Chief Complaint Chief Complaint  Patient presents with  . Knee Pain    HPI Sharon Mccarthy is a 42 y.o. female.  HPI   Sharon Mccarthy is a 42 y.o. female, patient with no pertinent past medical history, presenting to the ED with left knee pain beginning about 5 days ago.  Pain is lateral, sharp, nonradiating, severe.  Pain is worse with ambulation, palpation, and bending the knee.  Denies numbness, weakness, known injury, fever/chills, swelling, or any other complaints.       Past Medical History:  Diagnosis Date  . Vertigo     There are no active problems to display for this patient.   Past Surgical History:  Procedure Laterality Date  . CHOLECYSTECTOMY    . TUBAL LIGATION       OB History   None      Home Medications    Prior to Admission medications   Medication Sig Start Date End Date Taking? Authorizing Provider  Vitamin D, Ergocalciferol, (DRISDOL) 50000 units CAPS capsule 1 (ONE) CAPSULE ONCE A WEEK FOR 2 MONTHS 03/17/18  Yes [provider]  HYDROcodone-acetaminophen (NORCO/VICODIN) 5-325 MG tablet Take 1 tablet by mouth every 6 (six) hours as needed for severe pain. 04/21/18   Sudiksha Victor, Hillard DankerShawn C, PA-C    Family History No family history on file.  Social History Social History   Tobacco Use  . Smoking status: Never Smoker  Substance Use Topics  . Alcohol use: No  . Drug use: No     Allergies   Patient has no known allergies.   Review of Systems Review of Systems  Constitutional: Negative for fever.  Musculoskeletal: Positive for arthralgias. Negative for joint swelling.  Neurological: Negative for weakness and numbness.     Physical Exam Updated Vital Signs BP (!) 145/94 (BP Location: Left Arm)   Pulse 77   Temp 98.4 F (36.9 C) (Oral)   Resp 16   LMP 03/27/2018   SpO2 100%   Physical Exam    Constitutional: She appears well-developed and well-nourished. No distress.  HENT:  Head: Normocephalic and atraumatic.  Eyes: Conjunctivae are normal.  Neck: Neck supple.  Cardiovascular: Normal rate, regular rhythm and intact distal pulses.  Pulmonary/Chest: Effort normal.  Musculoskeletal: She exhibits tenderness. She exhibits no edema or deformity.  Tenderness to the left lateral knee.  Pain to the lateral knee with valgus stress.  No noted swelling, erythema, laxity, crepitus, or increased warmth.  Ambulatory with antalgic gait.  Neurological: She is alert.  Sensation to light touch grossly intact in the bilateral lower extremities. Strength 5/5 with flexion and extension of the left knee.  Skin: Skin is warm and dry. Capillary refill takes less than 2 seconds. She is not diaphoretic. No pallor.  Psychiatric: She has a normal mood and affect. Her behavior is normal.  Nursing note and vitals reviewed.    ED Treatments / Results  Labs (all labs ordered are listed, but only abnormal results are displayed) Labs Reviewed - No data to display  EKG None  Radiology Dg Knee Complete 4 Views Left  Result Date: 04/21/2018 CLINICAL DATA:  LEFT knee pain. No known injury. Initial encounter. EXAM: LEFT KNEE - COMPLETE 4+ VIEW COMPARISON:  None. FINDINGS: No evidence of fracture, dislocation, or joint effusion. No evidence of arthropathy or other focal bone abnormality. Soft  tissues are unremarkable. IMPRESSION: Negative. Electronically Signed   By: Harmon Pier M.D.   On: 04/21/2018 15:22    Procedures Procedures (including critical care time)  Medications Ordered in ED Medications  acetaminophen (TYLENOL) tablet 1,000 mg (has no administration in time range)     Initial Impression / Assessment and Plan / ED Course  I have reviewed the triage vital signs and the nursing notes.  Pertinent labs & imaging results that were available during my care of the patient were reviewed by me  and considered in my medical decision making (see chart for details).     Patient presents with acute left knee pain.  Neurovascularly intact.  Exam and history not suggestive of septic joint or other emergent condition.  No acute abnormality on x-ray.  Orthopedic follow-up. The patient was given instructions for home care as well as return precautions. Patient voices understanding of these instructions, accepts the plan, and is comfortable with discharge.  Final Clinical Impressions(s) / ED Diagnoses   Final diagnoses:  Acute pain of left knee    ED Discharge Orders        Ordered    HYDROcodone-acetaminophen (NORCO/VICODIN) 5-325 MG tablet  Every 6 hours PRN     04/21/18 1620       Anselm Pancoast, PA-C 04/21/18 1627    Linwood Dibbles, MD 04/22/18 1456

## 2019-05-18 DIAGNOSIS — E559 Vitamin D deficiency, unspecified: Secondary | ICD-10-CM | POA: Diagnosis not present

## 2019-05-18 DIAGNOSIS — Z6835 Body mass index (BMI) 35.0-35.9, adult: Secondary | ICD-10-CM | POA: Diagnosis not present

## 2019-05-18 DIAGNOSIS — Z131 Encounter for screening for diabetes mellitus: Secondary | ICD-10-CM | POA: Diagnosis not present

## 2019-05-18 DIAGNOSIS — Z0000 Encounter for general adult medical examination without abnormal findings: Secondary | ICD-10-CM | POA: Diagnosis not present

## 2019-05-18 DIAGNOSIS — Z130 Encounter for screening for diseases of the blood and blood-forming organs and certain disorders involving the immune mechanism: Secondary | ICD-10-CM | POA: Diagnosis not present

## 2019-05-18 DIAGNOSIS — Z01419 Encounter for gynecological examination (general) (routine) without abnormal findings: Secondary | ICD-10-CM | POA: Diagnosis not present

## 2019-05-18 DIAGNOSIS — Z1231 Encounter for screening mammogram for malignant neoplasm of breast: Secondary | ICD-10-CM | POA: Diagnosis not present

## 2019-05-18 DIAGNOSIS — Z13220 Encounter for screening for lipoid disorders: Secondary | ICD-10-CM | POA: Diagnosis not present

## 2019-05-18 DIAGNOSIS — Z1329 Encounter for screening for other suspected endocrine disorder: Secondary | ICD-10-CM | POA: Diagnosis not present

## 2019-06-04 DIAGNOSIS — Z124 Encounter for screening for malignant neoplasm of cervix: Secondary | ICD-10-CM | POA: Diagnosis not present

## 2019-06-04 DIAGNOSIS — Z01419 Encounter for gynecological examination (general) (routine) without abnormal findings: Secondary | ICD-10-CM | POA: Diagnosis not present

## 2019-06-04 DIAGNOSIS — Z1151 Encounter for screening for human papillomavirus (HPV): Secondary | ICD-10-CM | POA: Diagnosis not present

## 2020-07-11 DIAGNOSIS — E663 Overweight: Secondary | ICD-10-CM | POA: Diagnosis not present

## 2020-07-11 DIAGNOSIS — Z0000 Encounter for general adult medical examination without abnormal findings: Secondary | ICD-10-CM | POA: Diagnosis not present

## 2020-07-11 DIAGNOSIS — R635 Abnormal weight gain: Secondary | ICD-10-CM | POA: Diagnosis not present

## 2020-07-11 DIAGNOSIS — N951 Menopausal and female climacteric states: Secondary | ICD-10-CM | POA: Diagnosis not present

## 2020-08-08 DIAGNOSIS — Z01419 Encounter for gynecological examination (general) (routine) without abnormal findings: Secondary | ICD-10-CM | POA: Diagnosis not present

## 2020-08-08 DIAGNOSIS — E669 Obesity, unspecified: Secondary | ICD-10-CM | POA: Diagnosis not present

## 2020-08-08 DIAGNOSIS — Z6835 Body mass index (BMI) 35.0-35.9, adult: Secondary | ICD-10-CM | POA: Diagnosis not present

## 2020-08-13 ENCOUNTER — Other Ambulatory Visit: Payer: Self-pay | Admitting: Nurse Practitioner

## 2020-08-13 DIAGNOSIS — Z1231 Encounter for screening mammogram for malignant neoplasm of breast: Secondary | ICD-10-CM

## 2020-09-19 ENCOUNTER — Other Ambulatory Visit: Payer: Self-pay

## 2020-09-19 ENCOUNTER — Ambulatory Visit
Admission: RE | Admit: 2020-09-19 | Discharge: 2020-09-19 | Disposition: A | Discharge status: Home / Self Care | Payer: BC Managed Care – PPO | Source: Ambulatory Visit | Attending: Nurse Practitioner | Admitting: Nurse Practitioner

## 2020-09-19 DIAGNOSIS — Z1231 Encounter for screening mammogram for malignant neoplasm of breast: Secondary | ICD-10-CM | POA: Diagnosis not present

## 2021-07-13 DIAGNOSIS — F4321 Adjustment disorder with depressed mood: Secondary | ICD-10-CM | POA: Diagnosis not present

## 2021-09-20 IMAGING — MG DIGITAL SCREENING BILAT W/ CAD
4 series · 4 of 4 positions shown · non-contrast
Comparison: Previous exam(s).

CLINICAL DATA: Screening.

EXAM:
DIGITAL SCREENING BILATERAL MAMMOGRAM WITH CAD

[R MLO]
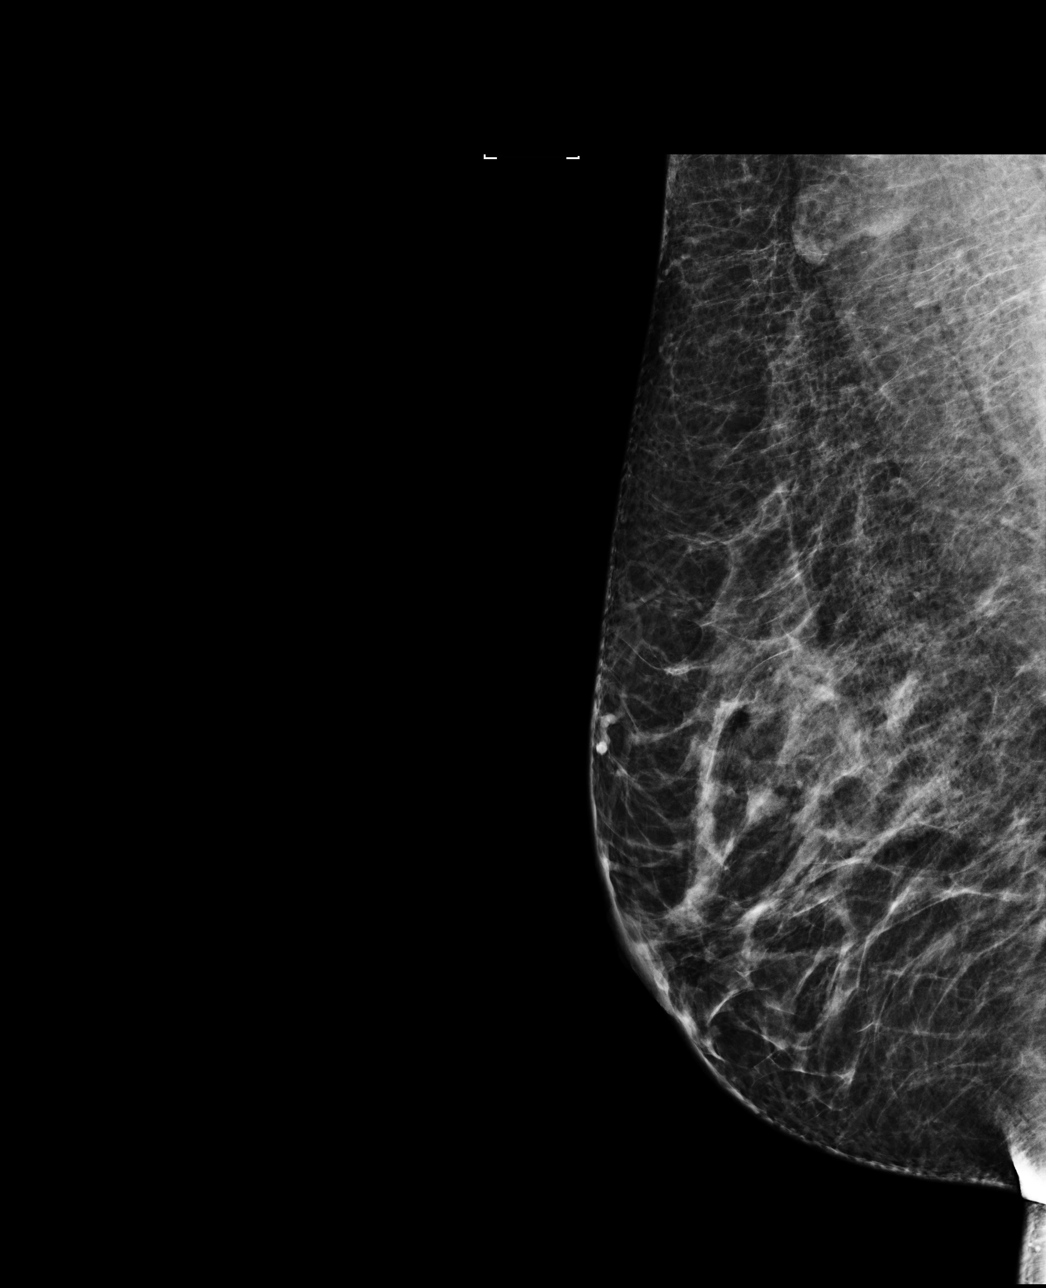

[L CC]
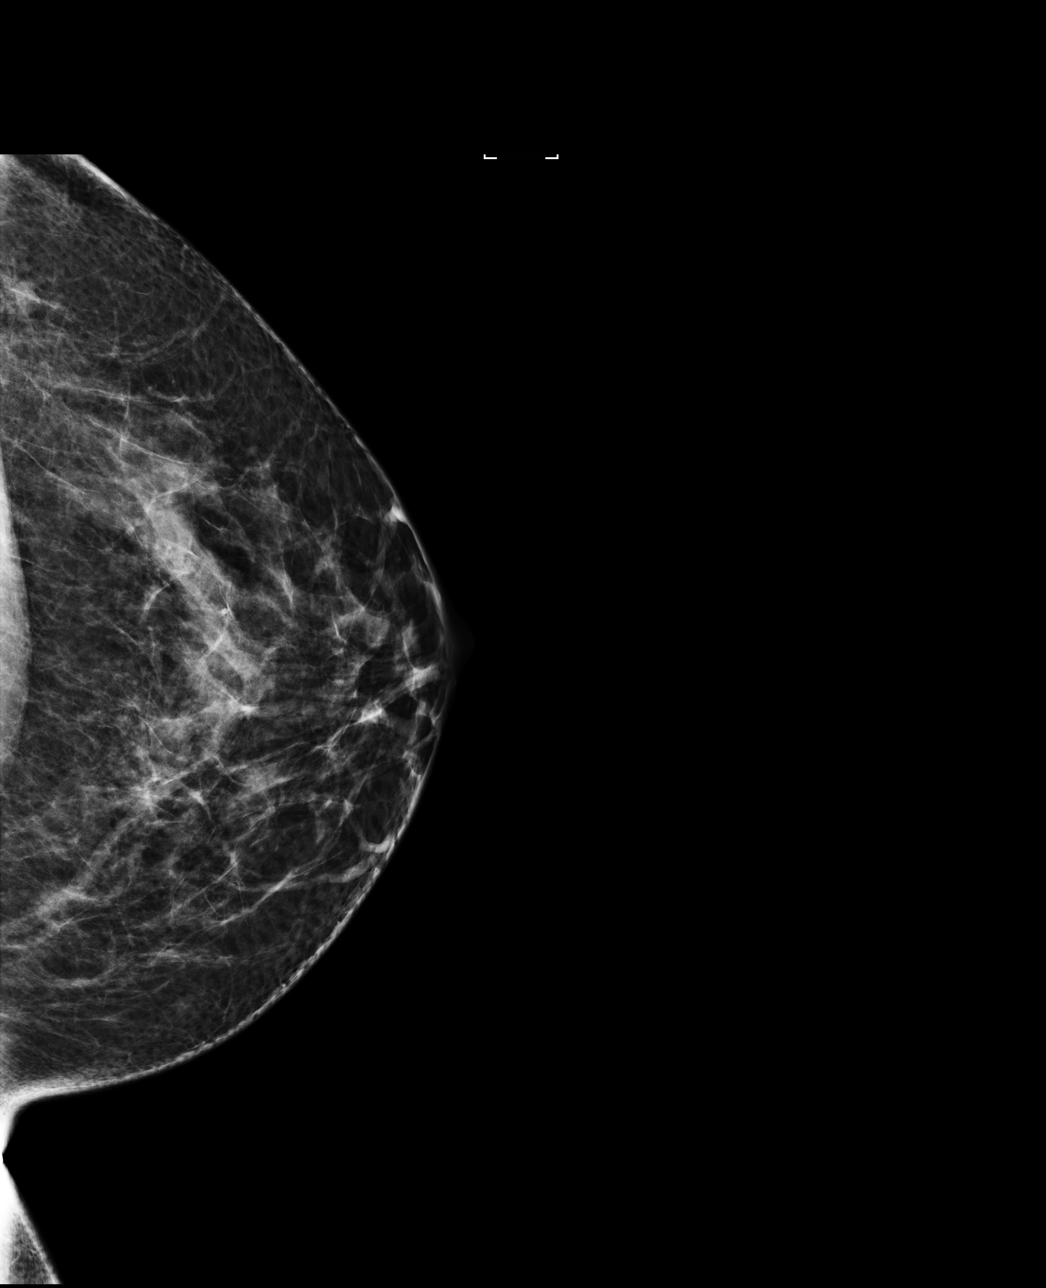

[L MLO]
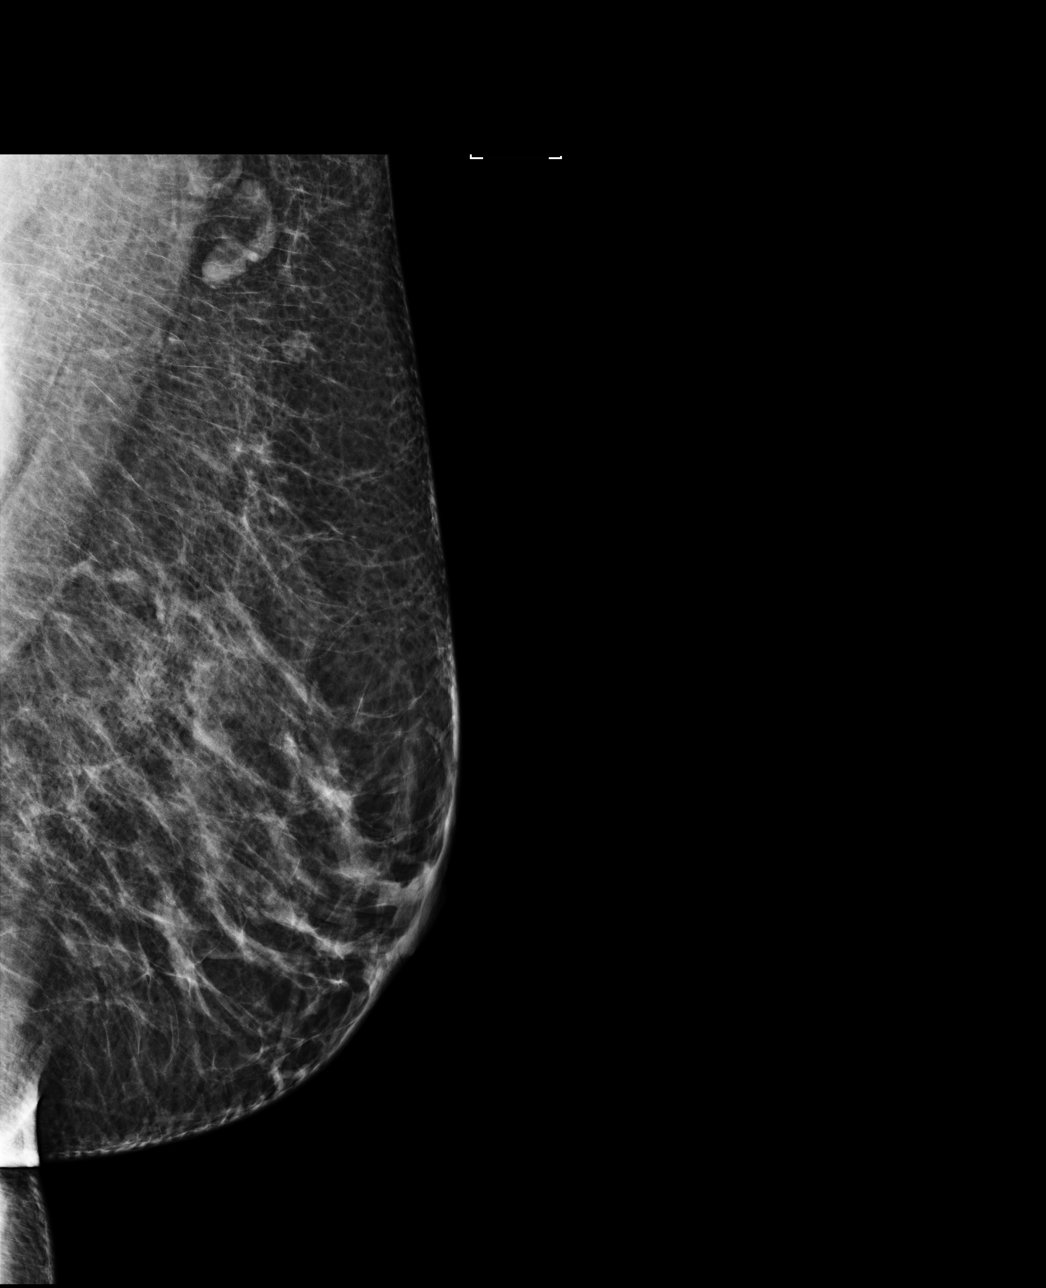

[R CC]
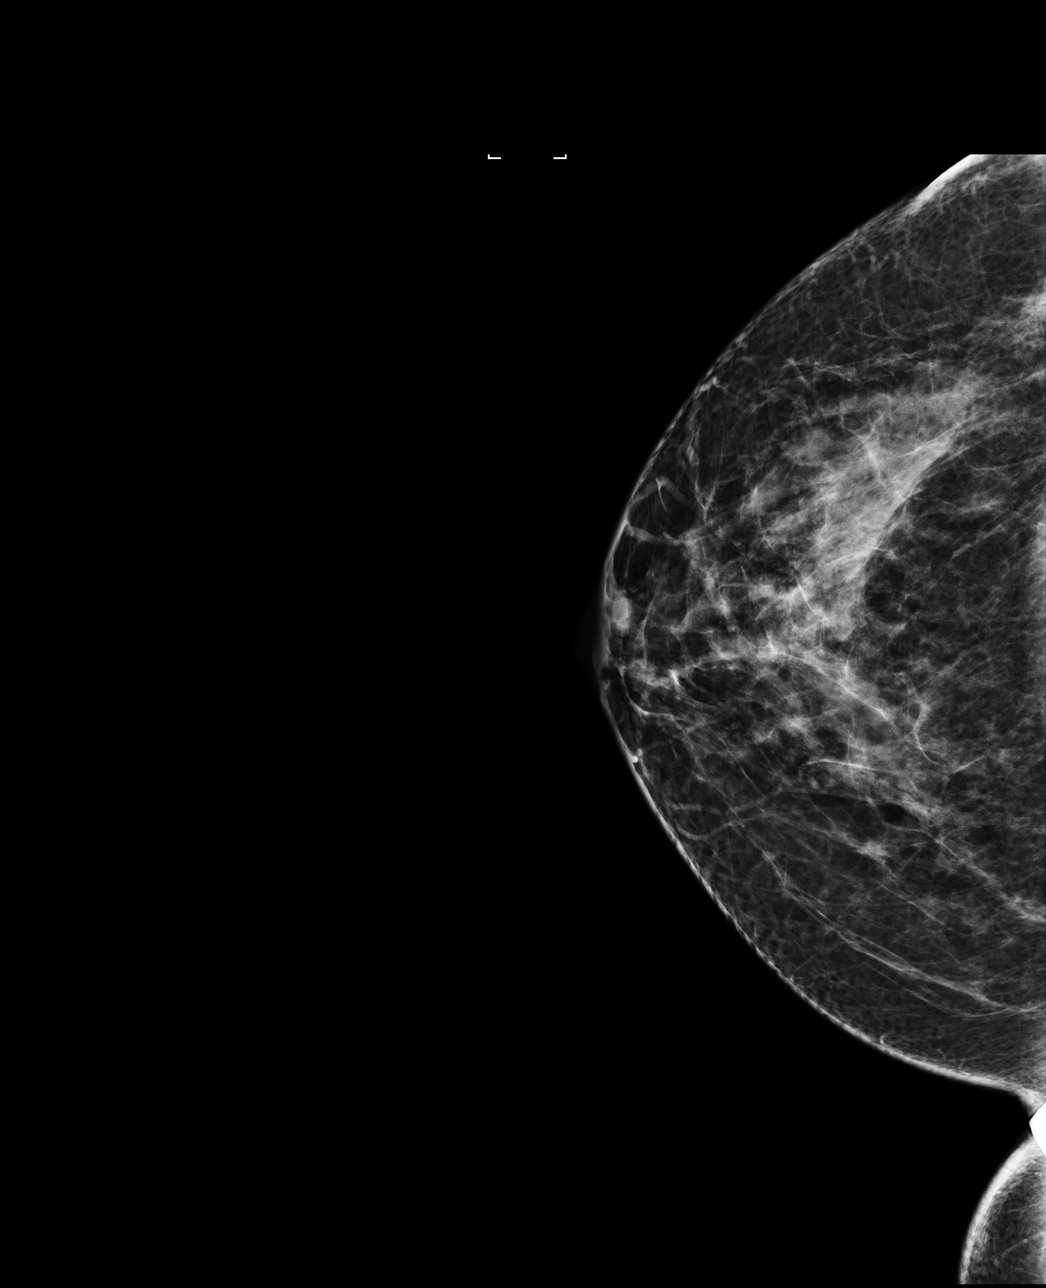

[4 of 4 positions shown; findings below may reference images not displayed]

ACR Breast Density Category c: The breast tissue is heterogeneously
dense, which may obscure small masses.
FINDINGS: There are no findings suspicious for malignancy. Images were
processed with CAD.
IMPRESSION: No mammographic evidence of malignancy. A result letter of this
screening mammogram will be mailed directly to the patient.

RECOMMENDATION:
Screening mammogram in one year. (Code:[id number])

BI-RADS CATEGORY  1: Negative.

## 2021-12-31 DIAGNOSIS — Z01419 Encounter for gynecological examination (general) (routine) without abnormal findings: Secondary | ICD-10-CM | POA: Diagnosis not present

## 2021-12-31 DIAGNOSIS — Z0000 Encounter for general adult medical examination without abnormal findings: Secondary | ICD-10-CM | POA: Diagnosis not present

## 2021-12-31 DIAGNOSIS — Z130 Encounter for screening for diseases of the blood and blood-forming organs and certain disorders involving the immune mechanism: Secondary | ICD-10-CM | POA: Diagnosis not present

## 2021-12-31 DIAGNOSIS — Z131 Encounter for screening for diabetes mellitus: Secondary | ICD-10-CM | POA: Diagnosis not present

## 2021-12-31 DIAGNOSIS — Z13220 Encounter for screening for lipoid disorders: Secondary | ICD-10-CM | POA: Diagnosis not present

## 2022-03-09 DIAGNOSIS — D251 Intramural leiomyoma of uterus: Secondary | ICD-10-CM | POA: Diagnosis not present

## 2022-03-09 DIAGNOSIS — N938 Other specified abnormal uterine and vaginal bleeding: Secondary | ICD-10-CM | POA: Diagnosis not present

## 2023-05-25 DIAGNOSIS — Z13220 Encounter for screening for lipoid disorders: Secondary | ICD-10-CM | POA: Diagnosis not present

## 2023-05-25 DIAGNOSIS — G4700 Insomnia, unspecified: Secondary | ICD-10-CM | POA: Diagnosis not present

## 2023-05-25 DIAGNOSIS — R030 Elevated blood-pressure reading, without diagnosis of hypertension: Secondary | ICD-10-CM | POA: Diagnosis not present

## 2023-05-25 DIAGNOSIS — F411 Generalized anxiety disorder: Secondary | ICD-10-CM | POA: Diagnosis not present

## 2023-05-25 DIAGNOSIS — F331 Major depressive disorder, recurrent, moderate: Secondary | ICD-10-CM | POA: Diagnosis not present

## 2023-05-25 DIAGNOSIS — Z0000 Encounter for general adult medical examination without abnormal findings: Secondary | ICD-10-CM | POA: Diagnosis not present

## 2023-05-25 DIAGNOSIS — Z114 Encounter for screening for human immunodeficiency virus [HIV]: Secondary | ICD-10-CM | POA: Diagnosis not present

## 2023-05-27 ENCOUNTER — Other Ambulatory Visit: Payer: Self-pay | Admitting: Family Medicine

## 2023-05-27 DIAGNOSIS — R030 Elevated blood-pressure reading, without diagnosis of hypertension: Secondary | ICD-10-CM | POA: Diagnosis not present

## 2023-05-27 DIAGNOSIS — Z1231 Encounter for screening mammogram for malignant neoplasm of breast: Secondary | ICD-10-CM

## 2023-05-27 DIAGNOSIS — Z23 Encounter for immunization: Secondary | ICD-10-CM | POA: Diagnosis not present

## 2023-05-27 DIAGNOSIS — Z0000 Encounter for general adult medical examination without abnormal findings: Secondary | ICD-10-CM | POA: Diagnosis not present

## 2023-06-08 DIAGNOSIS — Z7185 Encounter for immunization safety counseling: Secondary | ICD-10-CM | POA: Diagnosis not present

## 2023-06-08 DIAGNOSIS — I10 Essential (primary) hypertension: Secondary | ICD-10-CM | POA: Diagnosis not present

## 2023-06-16 DIAGNOSIS — Z1211 Encounter for screening for malignant neoplasm of colon: Secondary | ICD-10-CM | POA: Diagnosis not present

## 2023-06-16 DIAGNOSIS — E669 Obesity, unspecified: Secondary | ICD-10-CM | POA: Diagnosis not present

## 2023-06-16 DIAGNOSIS — K5904 Chronic idiopathic constipation: Secondary | ICD-10-CM | POA: Diagnosis not present

## 2023-06-17 ENCOUNTER — Ambulatory Visit
Admission: RE | Admit: 2023-06-17 | Discharge: 2023-06-17 | Disposition: A | Discharge status: Home / Self Care | Payer: BC Managed Care – PPO | Source: Ambulatory Visit | Attending: Family Medicine | Admitting: Family Medicine

## 2023-06-17 DIAGNOSIS — Z1231 Encounter for screening mammogram for malignant neoplasm of breast: Secondary | ICD-10-CM | POA: Diagnosis not present

## 2023-07-29 DIAGNOSIS — D122 Benign neoplasm of ascending colon: Secondary | ICD-10-CM | POA: Diagnosis not present

## 2023-07-29 DIAGNOSIS — K635 Polyp of colon: Secondary | ICD-10-CM | POA: Diagnosis not present

## 2023-07-29 DIAGNOSIS — Z1211 Encounter for screening for malignant neoplasm of colon: Secondary | ICD-10-CM | POA: Diagnosis not present

## 2024-05-29 DIAGNOSIS — Z6838 Body mass index (BMI) 38.0-38.9, adult: Secondary | ICD-10-CM | POA: Diagnosis not present

## 2024-05-29 DIAGNOSIS — Z8632 Personal history of gestational diabetes: Secondary | ICD-10-CM | POA: Diagnosis not present

## 2024-05-29 DIAGNOSIS — Z0000 Encounter for general adult medical examination without abnormal findings: Secondary | ICD-10-CM | POA: Diagnosis not present

## 2024-05-29 DIAGNOSIS — E66812 Obesity, class 2: Secondary | ICD-10-CM | POA: Diagnosis not present

## 2024-05-31 DIAGNOSIS — Z131 Encounter for screening for diabetes mellitus: Secondary | ICD-10-CM | POA: Diagnosis not present

## 2024-05-31 DIAGNOSIS — Z0000 Encounter for general adult medical examination without abnormal findings: Secondary | ICD-10-CM | POA: Diagnosis not present

## 2024-05-31 DIAGNOSIS — Z6838 Body mass index (BMI) 38.0-38.9, adult: Secondary | ICD-10-CM | POA: Diagnosis not present

## 2024-05-31 DIAGNOSIS — Z13220 Encounter for screening for lipoid disorders: Secondary | ICD-10-CM | POA: Diagnosis not present

## 2024-05-31 DIAGNOSIS — Z8632 Personal history of gestational diabetes: Secondary | ICD-10-CM | POA: Diagnosis not present

## 2024-07-26 ENCOUNTER — Other Ambulatory Visit: Payer: Self-pay | Admitting: Nurse Practitioner

## 2024-07-26 DIAGNOSIS — Z1231 Encounter for screening mammogram for malignant neoplasm of breast: Secondary | ICD-10-CM

## 2024-08-23 ENCOUNTER — Ambulatory Visit
Admission: RE | Admit: 2024-08-23 | Discharge: 2024-08-23 | Disposition: A | Discharge status: Home / Self Care | Source: Ambulatory Visit | Attending: Nurse Practitioner | Admitting: Nurse Practitioner

## 2024-08-23 DIAGNOSIS — Z1231 Encounter for screening mammogram for malignant neoplasm of breast: Secondary | ICD-10-CM | POA: Diagnosis not present

## 2024-08-28 ENCOUNTER — Other Ambulatory Visit: Payer: Self-pay | Admitting: Nurse Practitioner

## 2024-08-28 ENCOUNTER — Other Ambulatory Visit: Payer: Self-pay | Admitting: Obstetrics and Gynecology

## 2024-08-28 DIAGNOSIS — R928 Other abnormal and inconclusive findings on diagnostic imaging of breast: Secondary | ICD-10-CM

## 2024-09-10 ENCOUNTER — Ambulatory Visit
Admission: RE | Admit: 2024-09-10 | Discharge: 2024-09-10 | Disposition: A | Discharge status: Home / Self Care | Source: Ambulatory Visit | Attending: Nurse Practitioner | Admitting: Nurse Practitioner

## 2024-09-10 ENCOUNTER — Ambulatory Visit
Admission: RE | Admit: 2024-09-10 | Discharge: 2024-09-10 | Disposition: A | Source: Ambulatory Visit | Attending: Nurse Practitioner | Admitting: Nurse Practitioner

## 2024-09-10 DIAGNOSIS — R928 Other abnormal and inconclusive findings on diagnostic imaging of breast: Secondary | ICD-10-CM | POA: Diagnosis not present

## 2024-09-10 DIAGNOSIS — N6489 Other specified disorders of breast: Secondary | ICD-10-CM | POA: Diagnosis not present
